# Patient Record
Sex: Male | Born: 1970 | Race: Black or African American | Hispanic: No | Marital: Married
Health system: Southern US, Community
[De-identification: ages and names within clinical notes are randomized; demographics above are authoritative.]

## PROBLEM LIST (undated history)

## (undated) ENCOUNTER — Emergency Department (HOSPITAL_COMMUNITY): Payer: Self-pay | Source: Home / Self Care

---

## 1998-11-01 ENCOUNTER — Encounter: Payer: Self-pay | Admitting: Emergency Medicine

## 1998-11-01 ENCOUNTER — Emergency Department (HOSPITAL_COMMUNITY): Admission: EM | Admit: 1998-11-01 | Discharge: 1998-11-01 | Payer: Self-pay | Admitting: Emergency Medicine

## 1999-08-07 ENCOUNTER — Emergency Department (HOSPITAL_COMMUNITY): Admission: EM | Admit: 1999-08-07 | Discharge: 1999-08-07 | Payer: Self-pay | Admitting: Emergency Medicine

## 1999-08-08 ENCOUNTER — Encounter: Payer: Self-pay | Admitting: Emergency Medicine

## 1999-11-09 ENCOUNTER — Emergency Department (HOSPITAL_COMMUNITY): Admission: EM | Admit: 1999-11-09 | Discharge: 1999-11-09 | Payer: Self-pay | Admitting: Emergency Medicine

## 2002-07-12 ENCOUNTER — Encounter: Payer: Self-pay | Admitting: Emergency Medicine

## 2002-07-12 ENCOUNTER — Emergency Department (HOSPITAL_COMMUNITY): Admission: EM | Admit: 2002-07-12 | Discharge: 2002-07-12 | Payer: Self-pay | Admitting: Emergency Medicine

## 2004-01-12 ENCOUNTER — Emergency Department (HOSPITAL_COMMUNITY): Admission: EM | Admit: 2004-01-12 | Discharge: 2004-01-13 | Payer: Self-pay | Admitting: Emergency Medicine

## 2005-01-14 ENCOUNTER — Emergency Department (HOSPITAL_COMMUNITY): Admission: EM | Admit: 2005-01-14 | Discharge: 2005-01-14 | Payer: Self-pay | Admitting: Family Medicine

## 2006-12-04 ENCOUNTER — Emergency Department (HOSPITAL_COMMUNITY): Admission: EM | Admit: 2006-12-04 | Discharge: 2006-12-04 | Payer: Self-pay | Admitting: Emergency Medicine

## 2009-11-11 ENCOUNTER — Emergency Department (HOSPITAL_COMMUNITY)
Admission: EM | Admit: 2009-11-11 | Discharge: 2009-11-11 | Payer: Self-pay | Source: Home / Self Care | Admitting: Family Medicine

## 2009-11-11 ENCOUNTER — Emergency Department (HOSPITAL_COMMUNITY)
Admission: EM | Admit: 2009-11-11 | Discharge: 2009-11-12 | Payer: Self-pay | Source: Home / Self Care | Admitting: Emergency Medicine

## 2009-12-21 ENCOUNTER — Ambulatory Visit (HOSPITAL_COMMUNITY): Admission: RE | Admit: 2009-12-21 | Discharge: 2009-12-21 | Payer: Self-pay | Admitting: Family Medicine

## 2010-04-28 LAB — POCT URINALYSIS DIPSTICK
Bilirubin Urine: NEGATIVE
Glucose, UA: NEGATIVE mg/dL
Nitrite: NEGATIVE
Urobilinogen, UA: 1 mg/dL (ref 0.0–1.0)

## 2010-04-28 LAB — POCT I-STAT, CHEM 8
Chloride: 103 mEq/L (ref 96–112)
HCT: 46 % (ref 39.0–52.0)
Hemoglobin: 15.6 g/dL (ref 13.0–17.0)
Potassium: 3.7 mEq/L (ref 3.5–5.1)

## 2010-05-14 ENCOUNTER — Emergency Department (HOSPITAL_COMMUNITY)
Admission: EM | Admit: 2010-05-14 | Discharge: 2010-05-14 | Disposition: A | Discharge status: Home / Self Care | Payer: Medicaid Other | Attending: Emergency Medicine | Admitting: Emergency Medicine

## 2010-05-14 ENCOUNTER — Emergency Department (HOSPITAL_COMMUNITY): Payer: Medicaid Other

## 2010-05-14 DIAGNOSIS — J45909 Unspecified asthma, uncomplicated: Secondary | ICD-10-CM | POA: Insufficient documentation

## 2010-05-14 DIAGNOSIS — R110 Nausea: Secondary | ICD-10-CM | POA: Insufficient documentation

## 2010-05-14 DIAGNOSIS — R109 Unspecified abdominal pain: Secondary | ICD-10-CM | POA: Insufficient documentation

## 2010-05-14 DIAGNOSIS — N23 Unspecified renal colic: Secondary | ICD-10-CM | POA: Insufficient documentation

## 2010-05-14 LAB — URINALYSIS, ROUTINE W REFLEX MICROSCOPIC
Bilirubin Urine: NEGATIVE
Ketones, ur: NEGATIVE mg/dL
Nitrite: NEGATIVE
Specific Gravity, Urine: 1.022 (ref 1.005–1.030)
Urobilinogen, UA: 1 mg/dL (ref 0.0–1.0)
pH: 8 (ref 5.0–8.0)

## 2012-05-22 IMAGING — CR DG ABDOMEN 1V
1 series · 1 of 1 positions shown · non-contrast
Comparison: Lumbar spine radiographs 12/21/2009 and abdominal CT
11/12/2009.

CLINICAL DATA: Right flank and abdominal pain with sudden onset
today.  History of kidney stones.

ABDOMEN - 1 VIEW

[t abdomen supine]
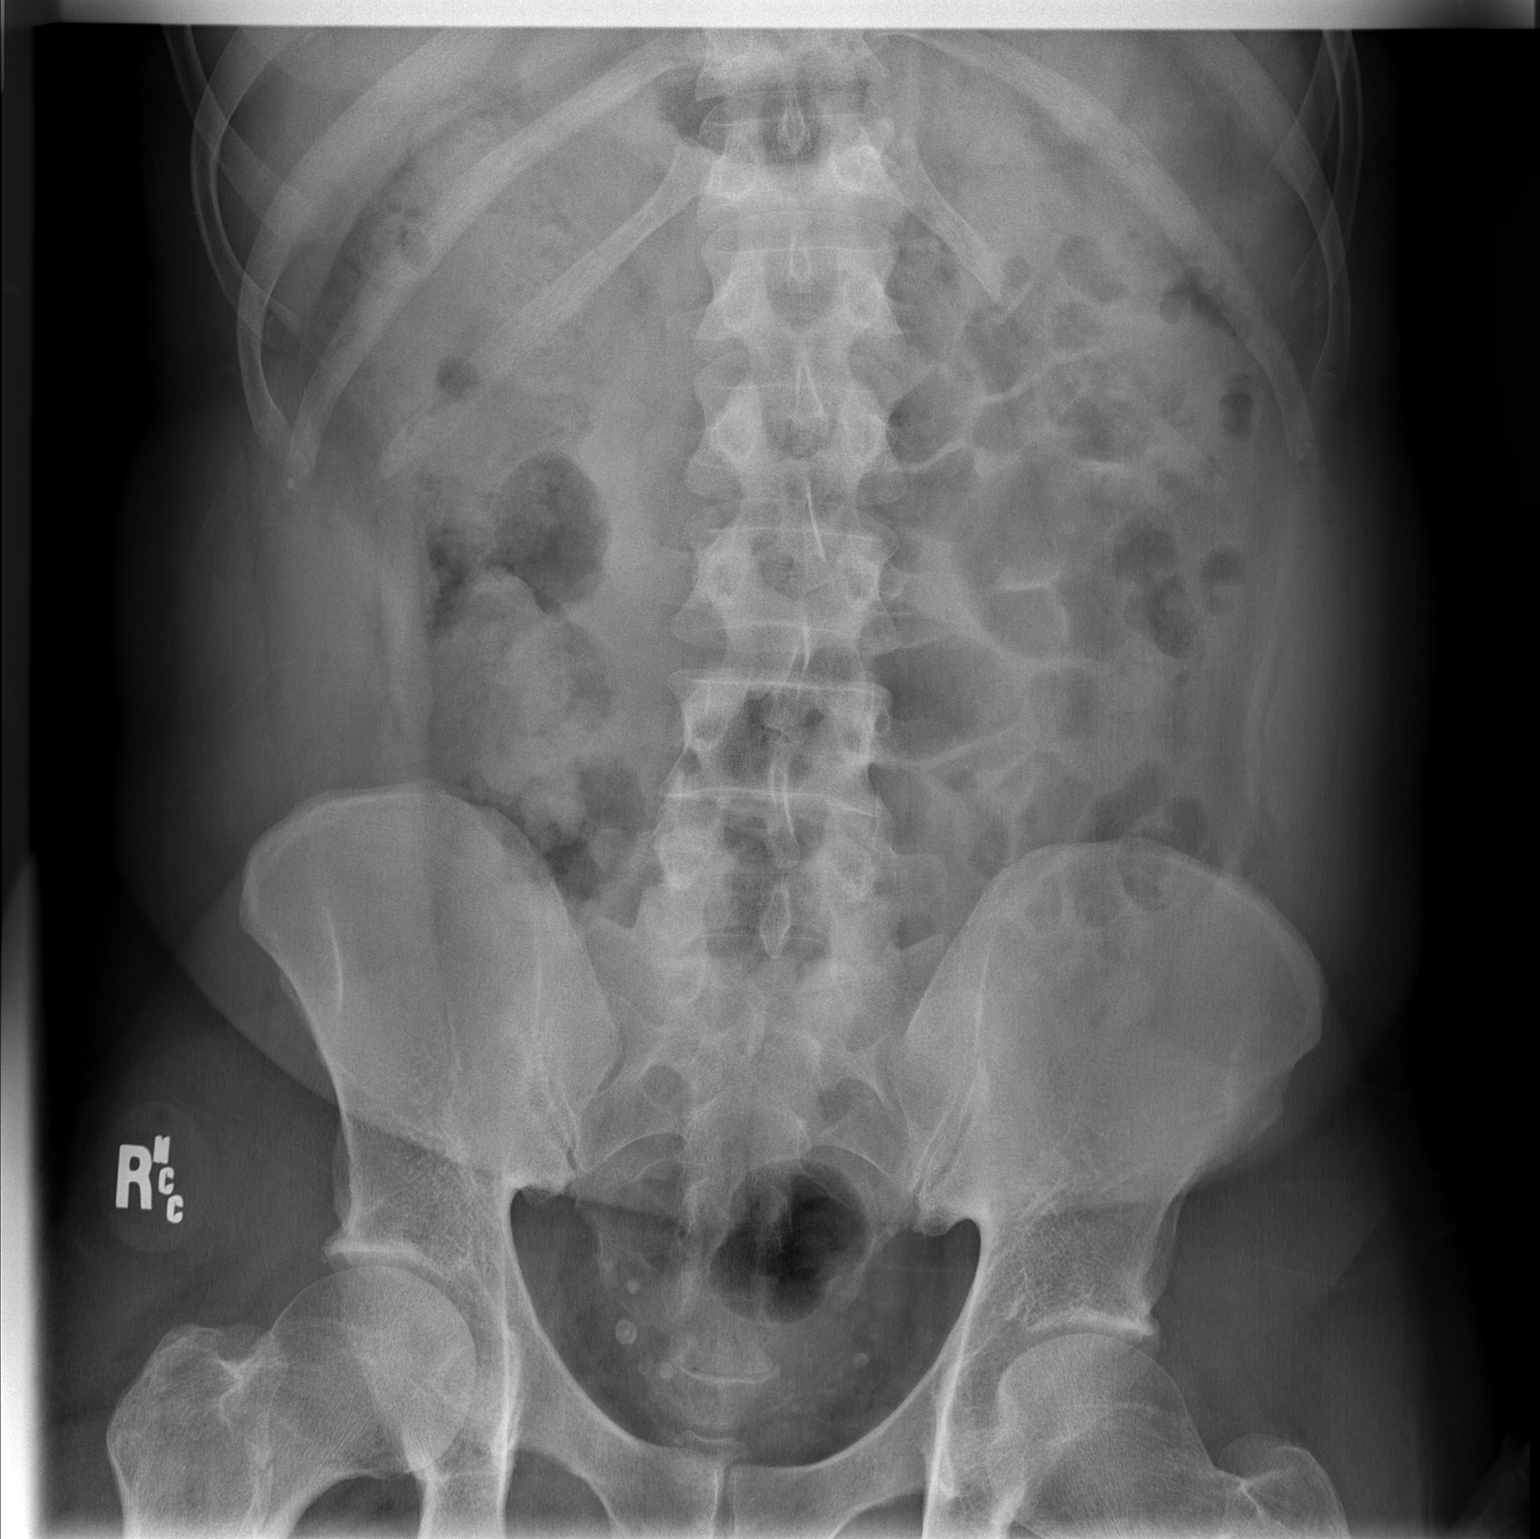

[1 of 1 positions shown; findings below may reference images not displayed]

FINDINGS: The bowel gas pattern is normal.  Pelvic calcifications
appear grossly unchanged and consistent with phleboliths.  No new
abdominal pelvic calcifications are visualized.  Bowel gas pattern
is normal.
IMPRESSION: No acute abdominal findings.  No radiographic evidence of urinary
tract calculus; CT should be considered if there is concern of a
ureteral calculus.

## 2017-06-13 ENCOUNTER — Emergency Department (HOSPITAL_COMMUNITY)
Admission: EM | Admit: 2017-06-13 | Discharge: 2017-07-14 | Disposition: E | Payer: No Typology Code available for payment source | Attending: Emergency Medicine | Admitting: Emergency Medicine

## 2017-06-13 ENCOUNTER — Encounter (HOSPITAL_COMMUNITY): Payer: Self-pay | Admitting: Emergency Medicine

## 2017-06-13 ENCOUNTER — Other Ambulatory Visit: Payer: Self-pay

## 2017-06-13 DIAGNOSIS — I469 Cardiac arrest, cause unspecified: Secondary | ICD-10-CM | POA: Diagnosis not present

## 2017-06-13 DIAGNOSIS — S0990XA Unspecified injury of head, initial encounter: Secondary | ICD-10-CM | POA: Diagnosis present

## 2017-06-13 DIAGNOSIS — Y999 Unspecified external cause status: Secondary | ICD-10-CM | POA: Insufficient documentation

## 2017-06-13 DIAGNOSIS — Y9389 Activity, other specified: Secondary | ICD-10-CM | POA: Insufficient documentation

## 2017-06-13 DIAGNOSIS — Y92410 Unspecified street and highway as the place of occurrence of the external cause: Secondary | ICD-10-CM | POA: Insufficient documentation

## 2017-06-13 LAB — BPAM FFP
BLOOD PRODUCT EXPIRATION DATE: 201905052359
BLOOD PRODUCT EXPIRATION DATE: 201905052359
ISSUE DATE / TIME: 201905010112
ISSUE DATE / TIME: 201905010112
UNIT TYPE AND RH: 6200
UNIT TYPE AND RH: 6200

## 2017-06-13 LAB — PREPARE FRESH FROZEN PLASMA
UNIT DIVISION: 0
UNIT DIVISION: 0

## 2017-06-13 LAB — CBG MONITORING, ED: Glucose-Capillary: 93 mg/dL (ref 65–99)

## 2017-06-13 LAB — BPAM RBC
BLOOD PRODUCT EXPIRATION DATE: 201905292359
Blood Product Expiration Date: 201905302359
ISSUE DATE / TIME: 201905010112
ISSUE DATE / TIME: 201905010112
UNIT TYPE AND RH: 9500
Unit Type and Rh: 9500

## 2017-06-13 MED ORDER — EPINEPHRINE PF 1 MG/10ML IJ SOSY
PREFILLED_SYRINGE | INTRAMUSCULAR | Status: AC | PRN
  Administered 2017-06-13 (×2): 1 mg via INTRAVENOUS

## 2017-06-13 MED ORDER — SODIUM BICARBONATE 8.4 % IV SOLN
INTRAVENOUS | Status: AC | PRN
  Administered 2017-06-13: 50 meq via INTRAVENOUS

## 2017-06-13 MED ORDER — SODIUM CHLORIDE 0.9 % IV SOLN
INTRAVENOUS | Status: AC | PRN
  Administered 2017-06-13: 1000 mL via INTRAVENOUS

## 2017-06-13 MED FILL — Medication: Qty: 1 | Status: AC

## 2017-07-14 NOTE — ED Notes (Signed)
[facility] notified : Ref.# [id number] by Dan Maker.

## 2017-07-14 NOTE — ED Notes (Signed)
Chest compressions continues using Lucas - pulse check asystole .

## 2017-07-14 NOTE — ED Notes (Addendum)
Medical Examiner ( Tim Mcneal) at bedside . He advised RN that family can not view pt at this time until further notice.
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
## 2017-07-14 NOTE — ED Notes (Signed)
Pulse check - asystole , Dr. Elesa Massed pronounce time of death 39.

## 2017-07-14 NOTE — ED Notes (Signed)
Patient transported to morgue , pt.'s personal belongings with [employer] .
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
## 2017-07-14 DEATH — deceased
